# Patient Record
Sex: Female | Born: 1960 | Race: White | Hispanic: No | Marital: Married | Smoking: Never smoker
Health system: Southern US, Community
[De-identification: ages and names within clinical notes are randomized; demographics above are authoritative.]

---

## 2004-11-04 ENCOUNTER — Ambulatory Visit: Payer: Self-pay

## 2004-11-11 ENCOUNTER — Ambulatory Visit: Payer: Self-pay

## 2005-12-06 ENCOUNTER — Ambulatory Visit: Payer: Self-pay

## 2006-12-13 ENCOUNTER — Ambulatory Visit: Payer: Self-pay

## 2006-12-15 ENCOUNTER — Ambulatory Visit: Payer: Self-pay

## 2007-06-19 ENCOUNTER — Ambulatory Visit: Payer: Self-pay

## 2008-06-05 ENCOUNTER — Ambulatory Visit: Payer: Self-pay

## 2008-08-15 ENCOUNTER — Ambulatory Visit: Payer: Self-pay | Admitting: Internal Medicine

## 2009-04-29 ENCOUNTER — Ambulatory Visit: Payer: Self-pay | Admitting: Internal Medicine

## 2009-06-18 ENCOUNTER — Ambulatory Visit: Payer: Self-pay

## 2009-06-23 ENCOUNTER — Other Ambulatory Visit: Payer: Self-pay

## 2010-06-08 ENCOUNTER — Ambulatory Visit: Payer: Self-pay | Admitting: Internal Medicine

## 2010-07-01 ENCOUNTER — Ambulatory Visit: Payer: Self-pay

## 2011-04-03 ENCOUNTER — Emergency Department: Payer: Self-pay | Admitting: Emergency Medicine

## 2011-04-22 ENCOUNTER — Ambulatory Visit: Payer: Self-pay | Admitting: Internal Medicine

## 2011-08-03 ENCOUNTER — Ambulatory Visit: Payer: Self-pay | Admitting: Obstetrics

## 2012-09-11 ENCOUNTER — Ambulatory Visit: Payer: Self-pay | Admitting: Family Medicine

## 2015-03-04 ENCOUNTER — Other Ambulatory Visit: Payer: Self-pay | Admitting: Family Medicine

## 2015-03-04 DIAGNOSIS — Z1231 Encounter for screening mammogram for malignant neoplasm of breast: Secondary | ICD-10-CM

## 2015-03-10 ENCOUNTER — Ambulatory Visit
Admission: RE | Admit: 2015-03-10 | Discharge: 2015-03-10 | Disposition: A | Discharge status: Home / Self Care | Payer: No Typology Code available for payment source | Source: Ambulatory Visit | Attending: Family Medicine | Admitting: Family Medicine

## 2015-03-10 ENCOUNTER — Other Ambulatory Visit: Payer: Self-pay | Admitting: Family Medicine

## 2015-03-10 DIAGNOSIS — Z1231 Encounter for screening mammogram for malignant neoplasm of breast: Secondary | ICD-10-CM | POA: Diagnosis not present

## 2015-03-10 DIAGNOSIS — M25475 Effusion, left foot: Secondary | ICD-10-CM

## 2015-03-10 DIAGNOSIS — M79672 Pain in left foot: Secondary | ICD-10-CM | POA: Diagnosis not present

## 2017-03-30 ENCOUNTER — Other Ambulatory Visit: Payer: Self-pay | Admitting: Family Medicine

## 2017-03-30 DIAGNOSIS — Z1231 Encounter for screening mammogram for malignant neoplasm of breast: Secondary | ICD-10-CM

## 2017-04-06 ENCOUNTER — Ambulatory Visit
Admission: RE | Admit: 2017-04-06 | Discharge: 2017-04-06 | Disposition: A | Discharge status: Home / Self Care | Payer: 59 | Source: Ambulatory Visit | Attending: Family Medicine | Admitting: Family Medicine

## 2017-04-06 DIAGNOSIS — Z1231 Encounter for screening mammogram for malignant neoplasm of breast: Secondary | ICD-10-CM | POA: Insufficient documentation

## 2019-04-23 ENCOUNTER — Other Ambulatory Visit: Payer: Self-pay | Admitting: Family Medicine

## 2019-04-23 DIAGNOSIS — Z1231 Encounter for screening mammogram for malignant neoplasm of breast: Secondary | ICD-10-CM

## 2019-04-26 ENCOUNTER — Ambulatory Visit
Admission: RE | Admit: 2019-04-26 | Discharge: 2019-04-26 | Disposition: A | Discharge status: Home / Self Care | Payer: BC Managed Care – PPO | Source: Ambulatory Visit | Attending: Family Medicine | Admitting: Family Medicine

## 2019-04-26 DIAGNOSIS — Z1231 Encounter for screening mammogram for malignant neoplasm of breast: Secondary | ICD-10-CM | POA: Diagnosis present

## 2020-02-24 ENCOUNTER — Other Ambulatory Visit: Payer: Self-pay | Admitting: Family Medicine

## 2020-02-24 ENCOUNTER — Other Ambulatory Visit (HOSPITAL_COMMUNITY): Payer: Self-pay | Admitting: Family Medicine

## 2020-02-24 DIAGNOSIS — R1011 Right upper quadrant pain: Secondary | ICD-10-CM

## 2020-02-28 ENCOUNTER — Ambulatory Visit: Payer: BC Managed Care – PPO

## 2020-06-04 ENCOUNTER — Other Ambulatory Visit: Payer: Self-pay | Admitting: Internal Medicine

## 2020-06-04 DIAGNOSIS — Z1231 Encounter for screening mammogram for malignant neoplasm of breast: Secondary | ICD-10-CM

## 2020-06-10 ENCOUNTER — Other Ambulatory Visit: Payer: Self-pay

## 2020-06-10 ENCOUNTER — Ambulatory Visit
Admission: RE | Admit: 2020-06-10 | Discharge: 2020-06-10 | Disposition: A | Discharge status: Home / Self Care | Payer: BC Managed Care – PPO | Source: Ambulatory Visit | Attending: Internal Medicine | Admitting: Internal Medicine

## 2020-06-10 DIAGNOSIS — Z1231 Encounter for screening mammogram for malignant neoplasm of breast: Secondary | ICD-10-CM | POA: Diagnosis present

## 2021-06-17 ENCOUNTER — Other Ambulatory Visit: Payer: Self-pay | Admitting: Internal Medicine

## 2021-06-17 DIAGNOSIS — Z1231 Encounter for screening mammogram for malignant neoplasm of breast: Secondary | ICD-10-CM

## 2021-07-06 ENCOUNTER — Other Ambulatory Visit: Payer: Self-pay

## 2021-07-06 ENCOUNTER — Ambulatory Visit
Admission: RE | Admit: 2021-07-06 | Discharge: 2021-07-06 | Disposition: A | Discharge status: Home / Self Care | Payer: BC Managed Care – PPO | Source: Ambulatory Visit | Attending: Internal Medicine | Admitting: Internal Medicine

## 2021-07-06 DIAGNOSIS — Z1231 Encounter for screening mammogram for malignant neoplasm of breast: Secondary | ICD-10-CM | POA: Insufficient documentation

## 2021-07-15 ENCOUNTER — Other Ambulatory Visit: Payer: Self-pay | Admitting: *Deleted

## 2021-07-15 DIAGNOSIS — R319 Hematuria, unspecified: Secondary | ICD-10-CM

## 2021-07-15 NOTE — Progress Notes (Signed)
07/16/21 6:11 PM   Adela Lank Vilar 09/01/1960 [id number]  Referring provider:  Rusty Aus, MD [facility] [place],  [place] [address] No chief complaint on file.    HPI: Michaela Fuller is a 61 y.o.female who presents today for further evaluation of hematuria.   She was seen by her PCP, Dr.Miller on 05/2021. She has had mild hematuria intermittently over the years. Her UA showed microscopic blood it was rechecked a month later on 06/28/2021 and revealed moderate blood, 4-10 RBCs, and few bacteria. She was referred to urology.   She reports that she does not have UTIs very often. She denies a smoking history or working in a factory.  No history of kidney stones.  No flank pain.  No gross hematuria.   PMH: No past medical history on file.  Surgical History: No past surgical history on file.  Home Medications:  Allergies as of 07/16/2021       Reactions   Penicillins Nausea Only, Nausea And Vomiting   Other reaction(s): Vomiting        Medication List        Accurate as of July 16, 2021  6:11 PM. If you have any questions, ask your nurse or doctor.          buPROPion 150 MG 12 hr tablet Commonly known as: WELLBUTRIN SR Take 150 mg by mouth 2 (two) times daily.   desvenlafaxine 50 MG 24 hr tablet Commonly known as: PRISTIQ Take 1 tablet by mouth daily.   levothyroxine 88 MCG tablet Commonly known as: SYNTHROID Take 1 tablet by mouth daily.   pregabalin 150 MG capsule Commonly known as: LYRICA Take by mouth.        Allergies:  Allergies  Allergen Reactions   Penicillins Nausea Only and Nausea And Vomiting    Other reaction(s): Vomiting    Family History: Family History  Problem Relation Age of Onset   Breast cancer Sister 15    Social History:  reports that she has never smoked. She has never used smokeless tobacco. She reports that she does not currently use alcohol. No history on file for drug  use.   Physical Exam: BP (!) 145/79    Pulse 90    Ht 5\' 6"  (1.676 m)    Wt 180 lb (81.6 kg)    BMI 29.05 kg/m   Constitutional:  Alert and oriented, No acute distress. HEENT: [place] AT, moist mucus membranes.  Trachea midline, no masses. Cardiovascular: No clubbing, cyanosis, or edema. Respiratory: Normal respiratory effort, no increased work of breathing. Skin: No rashes, bruises or suspicious lesions. Neurologic: Grossly intact, no focal deficits, moving all 4 extremities. Psychiatric: Normal mood and affect.   Urinalysis Component     Latest Ref Rng & Units 07/16/2021  Color, Urine     YELLOW YELLOW  Appearance     CLEAR CLEAR  Specific Gravity, Urine     1.005 - 1.030 <1.005 (L)  pH     5.0 - 8.0 5.5  Glucose, UA     NEGATIVE mg/dL NEGATIVE  Hgb urine dipstick     NEGATIVE SMALL (A)  Bilirubin Urine     NEGATIVE NEGATIVE  Ketones, ur     NEGATIVE mg/dL NEGATIVE  Protein     NEGATIVE mg/dL NEGATIVE  Nitrite     NEGATIVE NEGATIVE  Leukocytes,Ua     NEGATIVE NEGATIVE  Squamous Epithelial / LPF     0 - 5  0-5  WBC, UA     0 - 5 WBC/hpf NONE SEEN  RBC / HPF     0 - 5 RBC/hpf NONE SEEN  Bacteria, UA     NONE SEEN NONE SEEN   Assessment & Plan:    Low risk microscopic hematuria  - Urinalysis today unremarkable but she has had several incidents of low degree of microscopic hematuria in the absence of infection - We discussed the differential diagnosis for microscopic hematuria including nephrolithiasis, renal or upper tract tumors, bladder stones, UTIs, or bladder tumors as well as undetermined etiologies. - Per AUA guidelines, I did recommend a RUS and office cystoscopy. - RUS; scheduled   Return for cystoscopy and RUS results   Conley Rolls as a scribe for Hollice Espy, MD.,have documented all relevant documentation on the behalf of Hollice Espy, MD,as directed by  Hollice Espy, MD while in the presence of Hollice Espy, MD.  I have reviewed  the above documentation for accuracy and completeness, and I agree with the above.   Hollice Espy, MD  [facility] [address] [place], [place] [address] [phone]

## 2021-07-16 ENCOUNTER — Other Ambulatory Visit: Payer: Self-pay

## 2021-07-16 ENCOUNTER — Ambulatory Visit (INDEPENDENT_AMBULATORY_CARE_PROVIDER_SITE_OTHER): Payer: BC Managed Care – PPO | Admitting: Urology

## 2021-07-16 ENCOUNTER — Other Ambulatory Visit
Admission: RE | Admit: 2021-07-16 | Discharge: 2021-07-16 | Disposition: A | Discharge status: Home / Self Care | Payer: BC Managed Care – PPO | Attending: Urology | Admitting: Urology

## 2021-07-16 ENCOUNTER — Encounter: Payer: Self-pay | Admitting: Urology

## 2021-07-16 VITALS — BP 145/79 | HR 90 | Ht 66.0 in | Wt 180.0 lb

## 2021-07-16 DIAGNOSIS — R319 Hematuria, unspecified: Secondary | ICD-10-CM | POA: Insufficient documentation

## 2021-07-16 LAB — URINALYSIS, COMPLETE (UACMP) WITH MICROSCOPIC
Bacteria, UA: NONE SEEN
Bilirubin Urine: NEGATIVE
Glucose, UA: NEGATIVE mg/dL
Ketones, ur: NEGATIVE mg/dL
Leukocytes,Ua: NEGATIVE
Nitrite: NEGATIVE
Protein, ur: NEGATIVE mg/dL
RBC / HPF: NONE SEEN RBC/hpf (ref 0–5)
Specific Gravity, Urine: <1.005 — ABNORMAL LOW (ref 1.005–1.030)
WBC, UA: NONE SEEN WBC/hpf (ref 0–5)
pH: 5.5 (ref 5.0–8.0)

## 2021-07-16 NOTE — Patient Instructions (Signed)

## 2021-07-22 ENCOUNTER — Encounter: Payer: Self-pay | Admitting: Urology

## 2021-07-28 ENCOUNTER — Ambulatory Visit
Admission: RE | Admit: 2021-07-28 | Discharge: 2021-07-28 | Disposition: A | Discharge status: Home / Self Care | Payer: BC Managed Care – PPO | Source: Ambulatory Visit | Attending: Urology | Admitting: Urology

## 2021-07-28 ENCOUNTER — Other Ambulatory Visit: Payer: Self-pay

## 2021-07-28 DIAGNOSIS — R319 Hematuria, unspecified: Secondary | ICD-10-CM | POA: Insufficient documentation

## 2021-07-30 ENCOUNTER — Ambulatory Visit: Payer: BC Managed Care – PPO | Admitting: Urology

## 2021-09-03 ENCOUNTER — Ambulatory Visit: Payer: BC Managed Care – PPO | Admitting: Urology

## 2021-09-15 ENCOUNTER — Other Ambulatory Visit: Payer: Self-pay

## 2021-09-15 DIAGNOSIS — R319 Hematuria, unspecified: Secondary | ICD-10-CM

## 2021-09-16 NOTE — Progress Notes (Signed)
? ?  09/17/2021 ? ?CC:  ?Chief Complaint  ?Patient presents with  ? Cysto  ? ? ?HPI: ?Michaela Fuller is a 61 y.o.female with a personal history of low risk microscopic hematuria, who presents today for a diagnostic cystoscopy.  ? ?She has had mild hematuria intermittently over the years. ? ?RUS on 07/28/2021 visualized normal radiographic imaging.  ? ?UA shows small Hgb urine but was otherwise unremarkable.  ? ?Vitals:  ? 09/17/21 1523  ?BP: 139/85  ?Pulse: 88  ? ?NED. A&Ox3.   ?No respiratory distress   ?Abd soft, NT, ND ?Normal external genitalia with patent urethral meatus ? ?Cystoscopy Procedure Note ? ?Patient identification was confirmed, informed consent was obtained, and patient was prepped using Betadine solution.  Lidocaine jelly was administered per urethral meatus.   ? ?Procedure: ?- Flexible cystoscope introduced, without any difficulty.   ?- Thorough search of the bladder revealed: ?   normal urethral meatus ?   normal urothelium ?   no stones ?   no ulcers  ?   no tumors ?   no urethral polyps ?   no trabeculation ? ?- Ureteral orifices were normal in position and appearance. ? ?Post-Procedure: ?- Patient tolerated the procedure well ? ?Assessment/ Plan: ? ?Low risk microscopic hematuria  ?- Cystoscopy is unremarkable  ?- UA unremarkable today  ?-Renal ultrasound also negative ? ?Discussed today if microscopic hematuria persists, would recommend reevaluation in 2 to 3 years.  We will CC PCP to refer back as needed. ? ?Conley Rolls as a scribe for Hollice Espy, MD.,have documented all relevant documentation on the behalf of Hollice Espy, MD,as directed by  Hollice Espy, MD while in the presence of Hollice Espy, MD. ?

## 2021-09-17 ENCOUNTER — Other Ambulatory Visit
Admission: RE | Admit: 2021-09-17 | Discharge: 2021-09-17 | Disposition: A | Discharge status: Home / Self Care | Payer: BC Managed Care – PPO | Source: Ambulatory Visit | Attending: Urology | Admitting: Urology

## 2021-09-17 ENCOUNTER — Ambulatory Visit (INDEPENDENT_AMBULATORY_CARE_PROVIDER_SITE_OTHER): Payer: BC Managed Care – PPO | Admitting: Urology

## 2021-09-17 VITALS — BP 139/85 | HR 88 | Ht 66.0 in | Wt 190.0 lb

## 2021-09-17 DIAGNOSIS — R3129 Other microscopic hematuria: Secondary | ICD-10-CM

## 2021-09-17 DIAGNOSIS — R319 Hematuria, unspecified: Secondary | ICD-10-CM

## 2021-09-17 LAB — URINALYSIS, COMPLETE (UACMP) WITH MICROSCOPIC
Bacteria, UA: NONE SEEN
Bilirubin Urine: NEGATIVE
Glucose, UA: NEGATIVE mg/dL
Ketones, ur: NEGATIVE mg/dL
Leukocytes,Ua: NEGATIVE
Nitrite: NEGATIVE
Protein, ur: NEGATIVE mg/dL
Specific Gravity, Urine: 1.01 (ref 1.005–1.030)
Squamous Epithelial / HPF: NONE SEEN (ref 0–5)
pH: 7 (ref 5.0–8.0)

## 2022-06-23 ENCOUNTER — Other Ambulatory Visit: Payer: Self-pay | Admitting: Internal Medicine

## 2022-06-23 DIAGNOSIS — Z1231 Encounter for screening mammogram for malignant neoplasm of breast: Secondary | ICD-10-CM

## 2022-07-07 ENCOUNTER — Ambulatory Visit
Admission: RE | Admit: 2022-07-07 | Discharge: 2022-07-07 | Disposition: A | Discharge status: Home / Self Care | Payer: BC Managed Care – PPO | Source: Ambulatory Visit | Attending: Internal Medicine | Admitting: Internal Medicine

## 2022-07-07 DIAGNOSIS — Z1231 Encounter for screening mammogram for malignant neoplasm of breast: Secondary | ICD-10-CM | POA: Diagnosis present

## 2023-06-08 ENCOUNTER — Other Ambulatory Visit: Payer: Self-pay | Admitting: Internal Medicine

## 2023-06-08 DIAGNOSIS — Z1231 Encounter for screening mammogram for malignant neoplasm of breast: Secondary | ICD-10-CM

## 2023-07-10 ENCOUNTER — Ambulatory Visit: Payer: BC Managed Care – PPO

## 2023-07-17 ENCOUNTER — Ambulatory Visit
Admission: RE | Admit: 2023-07-17 | Discharge: 2023-07-17 | Disposition: A | Discharge status: Home / Self Care | Payer: BC Managed Care – PPO | Source: Ambulatory Visit | Attending: Internal Medicine | Admitting: Internal Medicine

## 2023-07-17 DIAGNOSIS — Z1231 Encounter for screening mammogram for malignant neoplasm of breast: Secondary | ICD-10-CM | POA: Insufficient documentation

## 2023-09-30 IMAGING — MG MM DIGITAL SCREENING BILAT W/ TOMO AND CAD
6 of 10 series · 6 of 30 positions shown · non-contrast
Comparison: Previous exam(s).

CLINICAL DATA: Screening.

EXAM:
DIGITAL SCREENING BILATERAL MAMMOGRAM WITH TOMOSYNTHESIS AND CAD
TECHNIQUE: Bilateral screening digital craniocaudal and mediolateral oblique
mammograms were obtained. Bilateral screening digital breast
tomosynthesis was performed. The images were evaluated with
computer-aided detection.

[R MLO synth-2D]
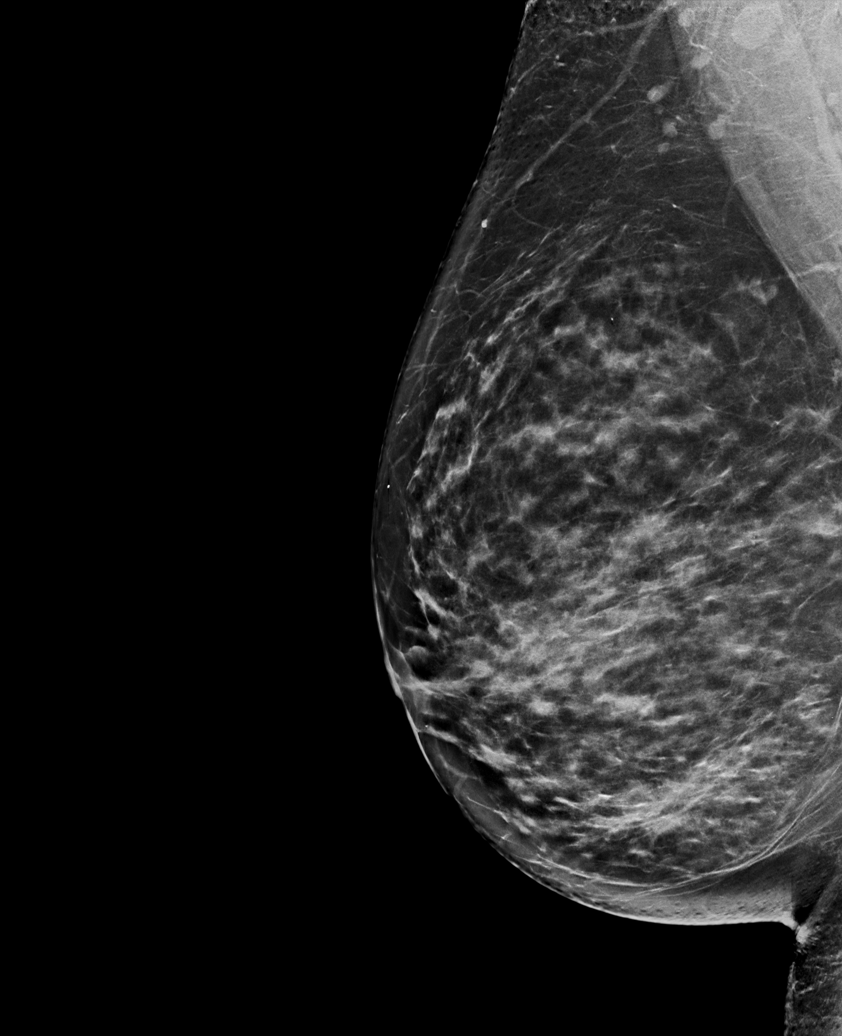

[L MLO synth-2D]
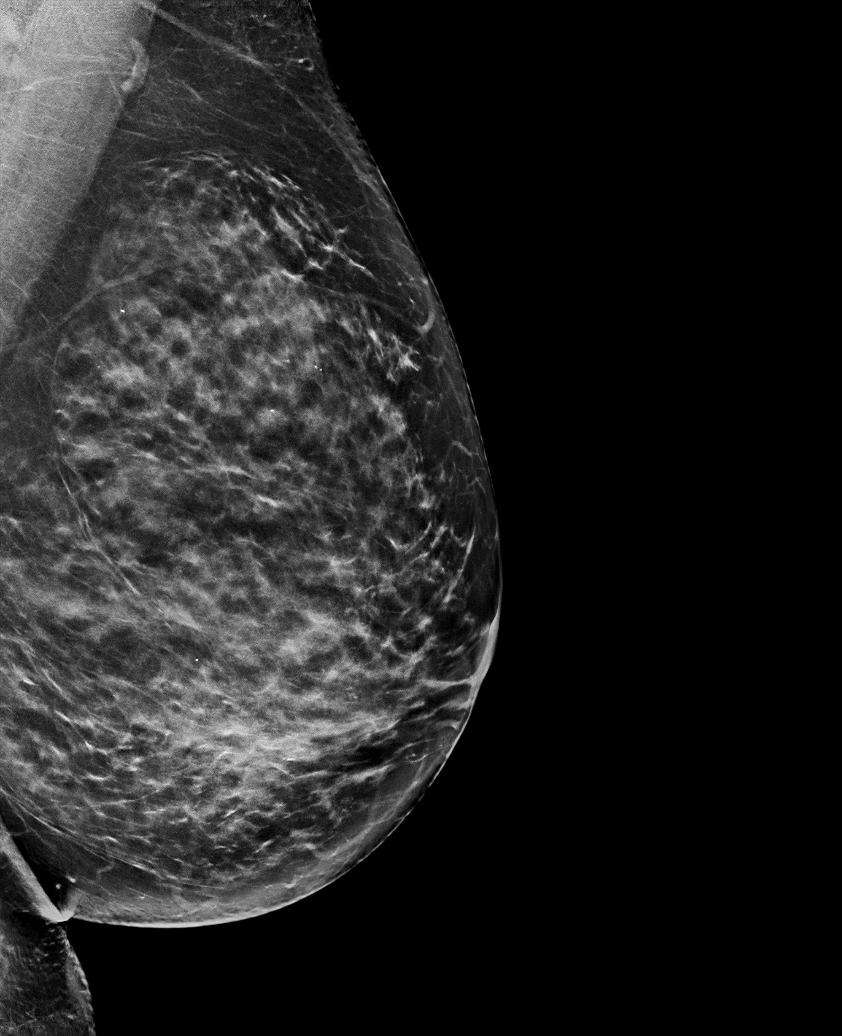

[R CC synth-2D (1 of 2)]
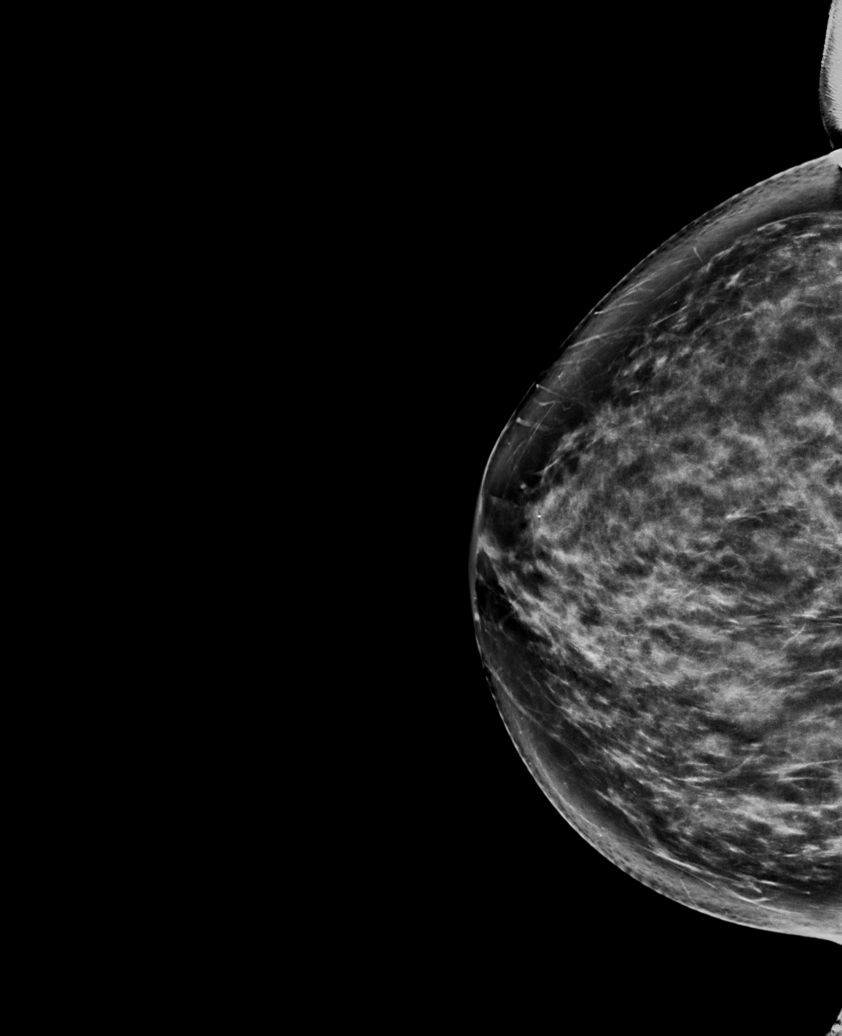

[R CC synth-2D (2 of 2)]
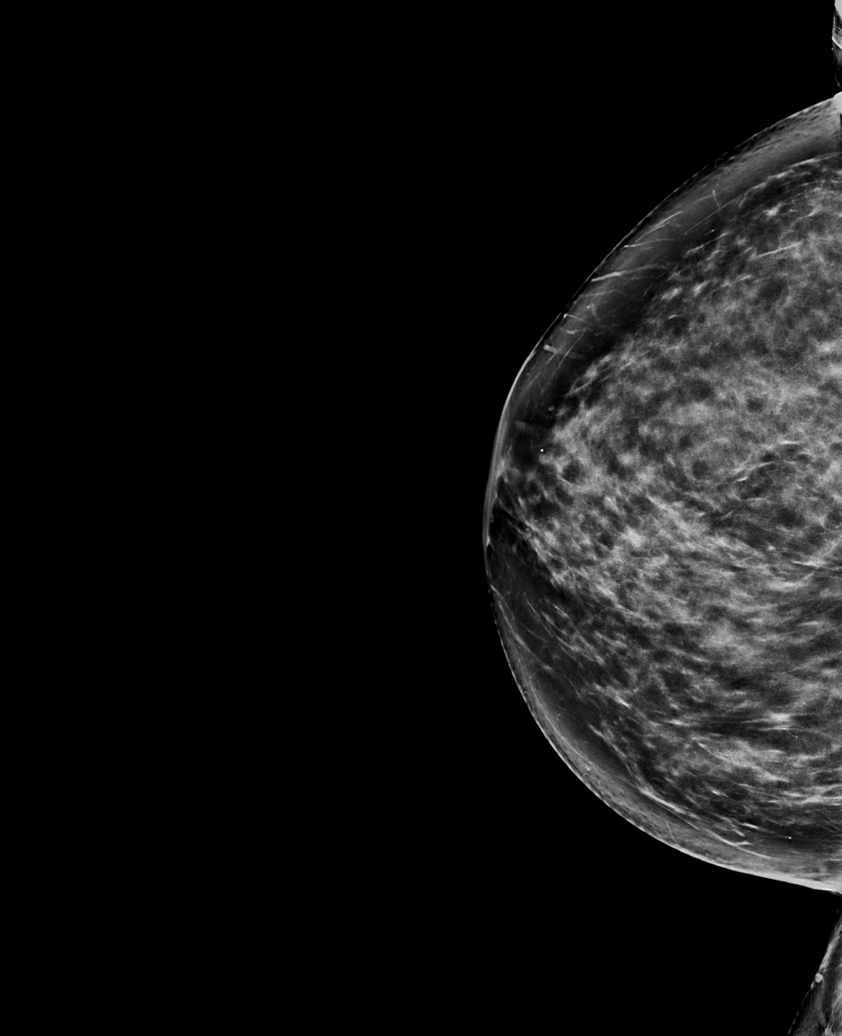

[L CC synth-2D]
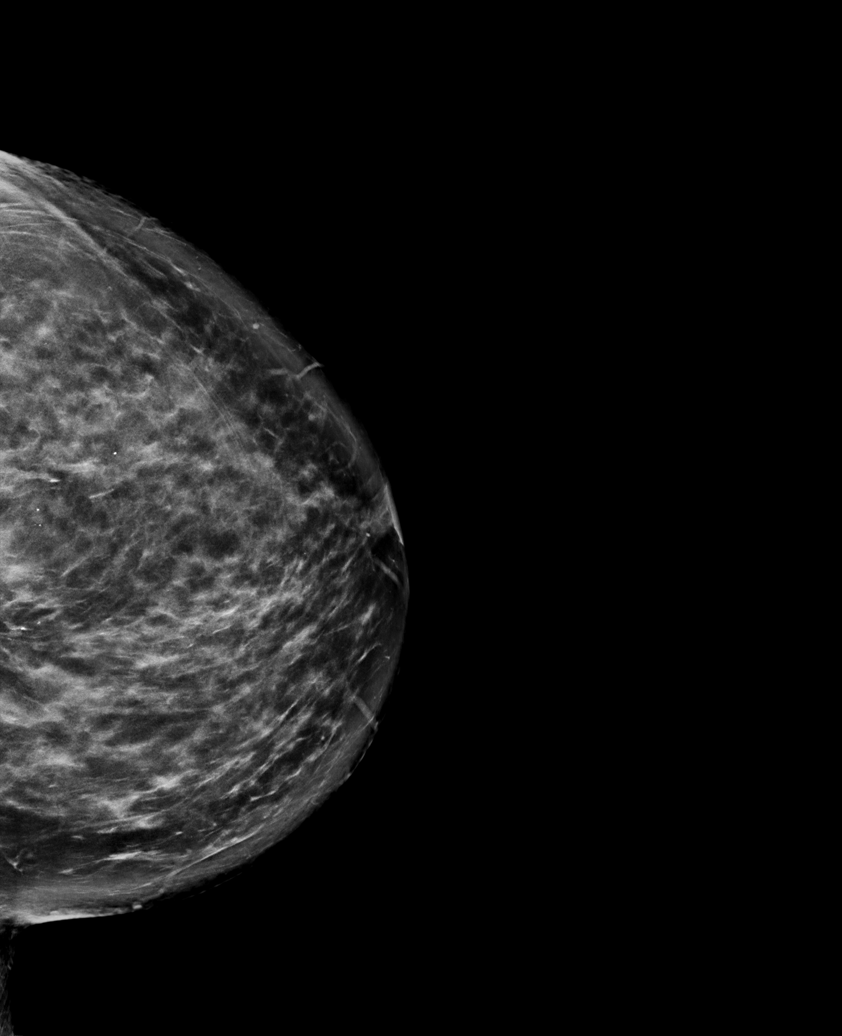

[R CC tomo · tomo slice 43/85.0]
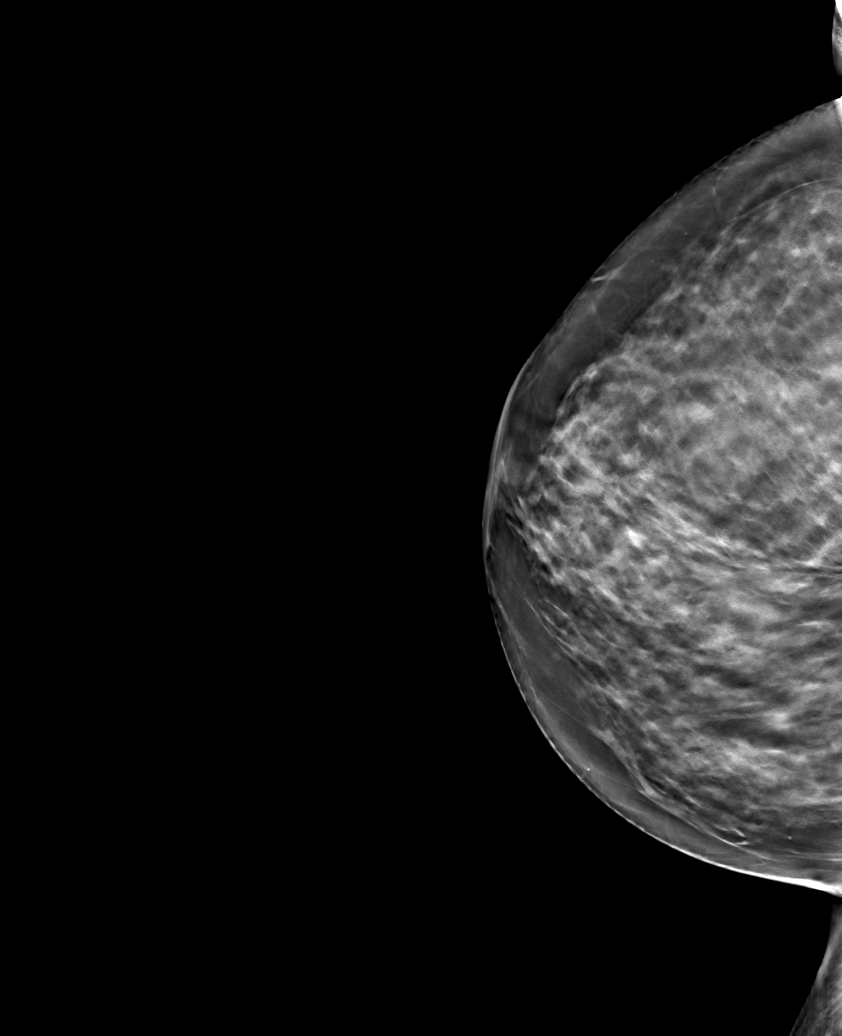

[6 of 30 positions shown; findings below may reference images not displayed]

ACR Breast Density Category c: The breast tissue is heterogeneously
dense, which may obscure small masses.
FINDINGS: There are no findings suspicious for malignancy.
IMPRESSION: No mammographic evidence of malignancy. A result letter of this
screening mammogram will be mailed directly to the patient.

RECOMMENDATION:
Screening mammogram in one year. (Code:Q3-W-BC3)

BI-RADS CATEGORY  1: Negative.

## 2023-10-22 IMAGING — US US RENAL
1 series · 14 of 25 positions shown · non-contrast
Comparison: None.

CLINICAL DATA: Microscopic hematuria

EXAM:
RENAL / URINARY TRACT ULTRASOUND COMPLETE

[Series 1: us renal · 0.23mm/px · 14 of 30 slices shown]
[im 1/30]
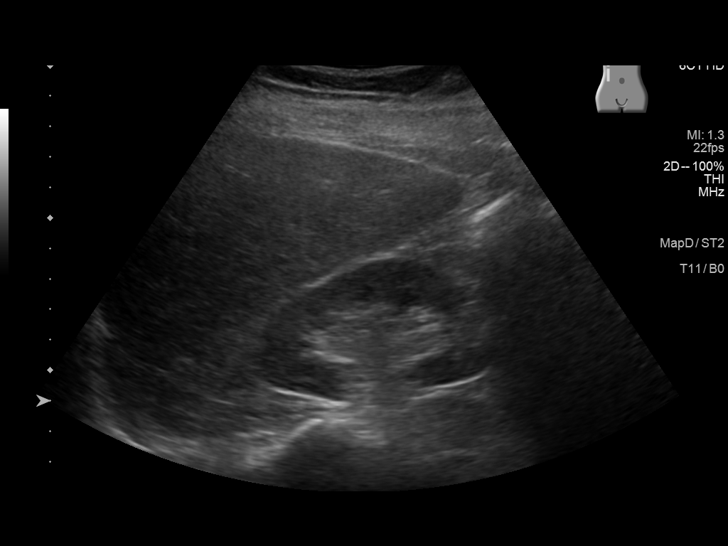
[im 3/30]
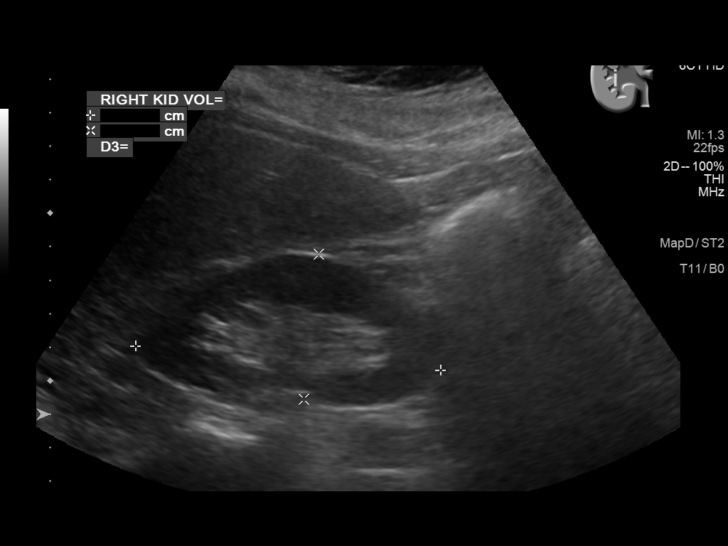
[im 5/30]
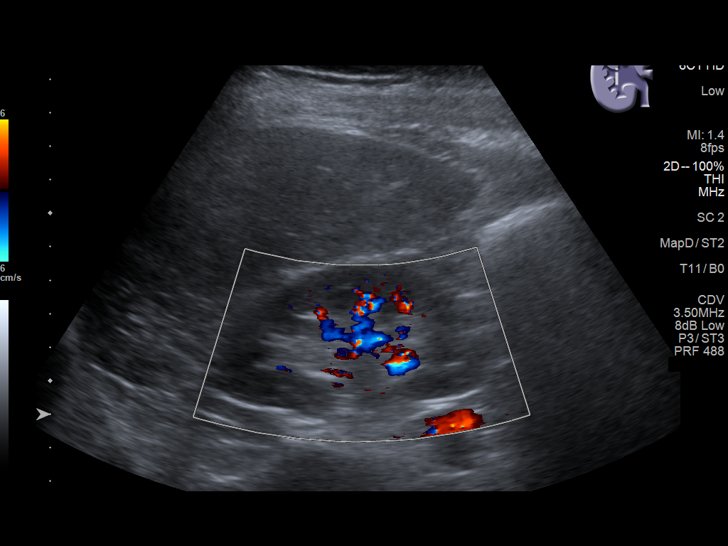
[im 8/30]
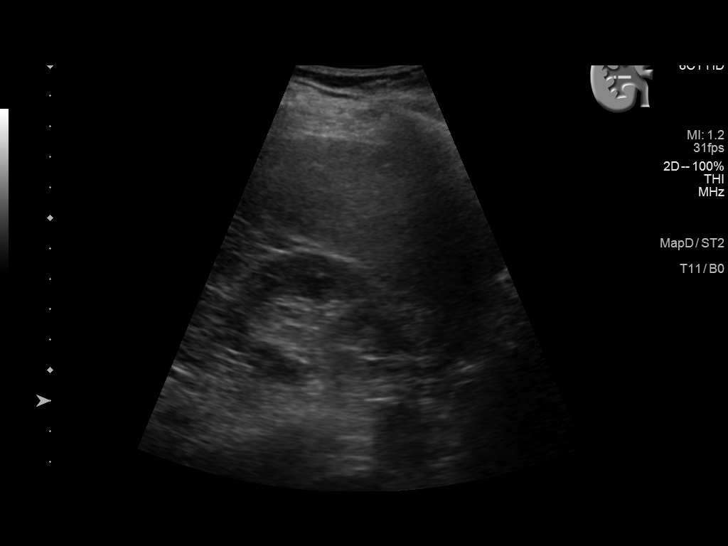
[im 10/30]
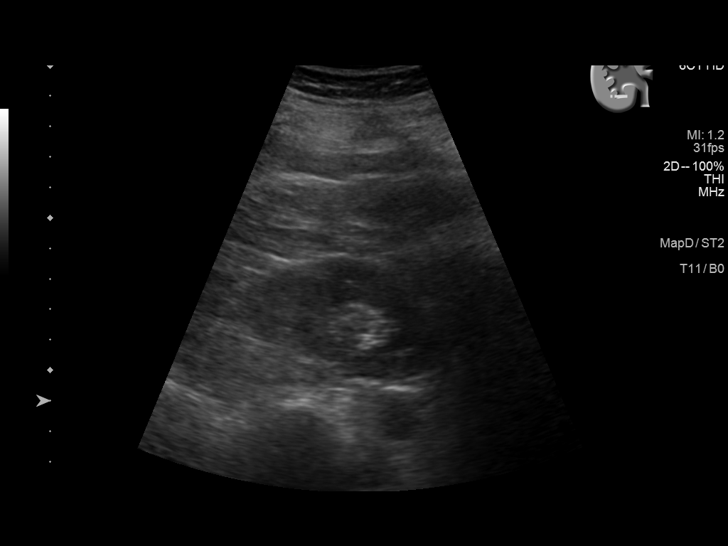
[im 11/30]
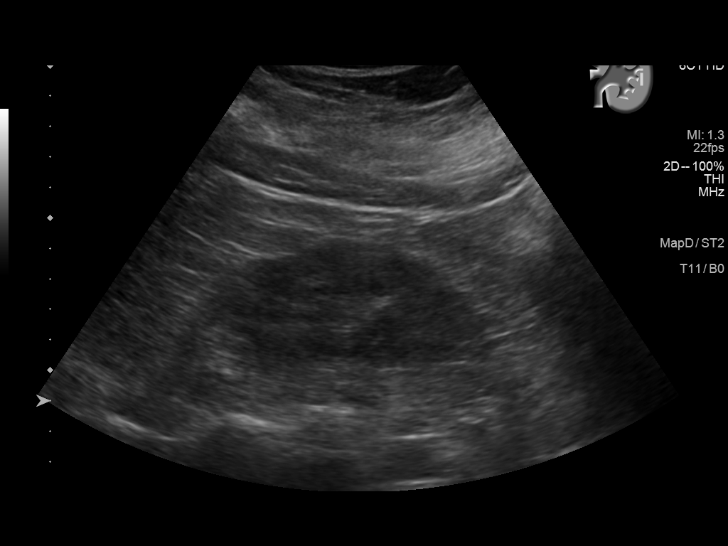
[im 14/30]
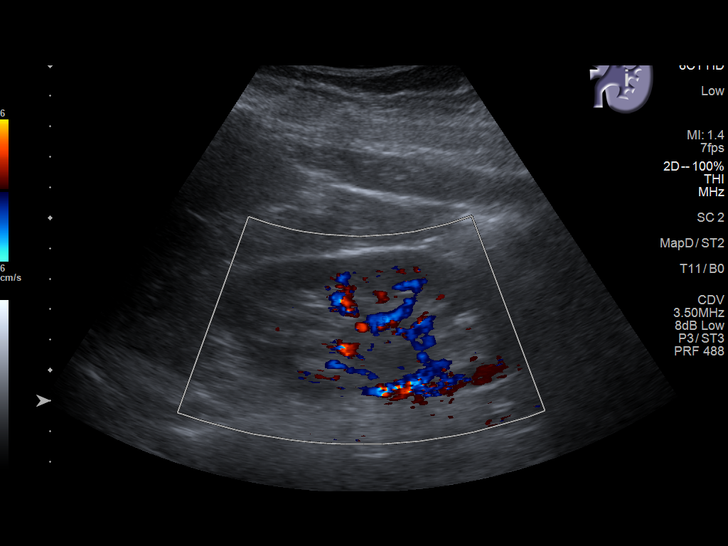
[im 16/30]
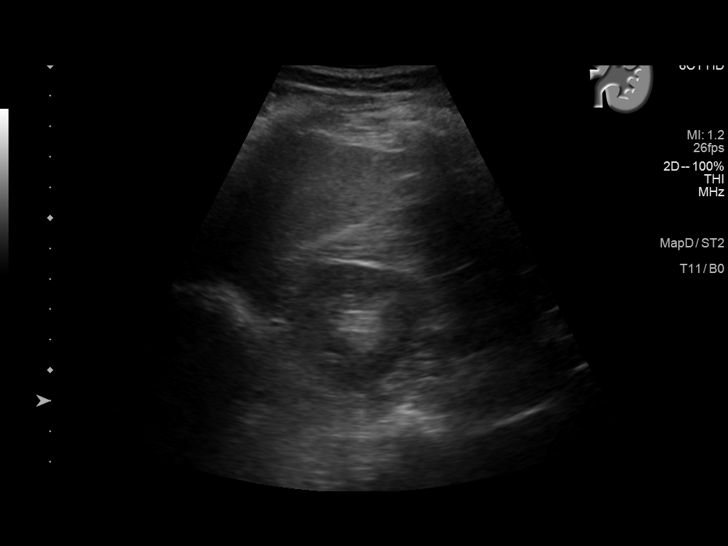
[im 19/30]
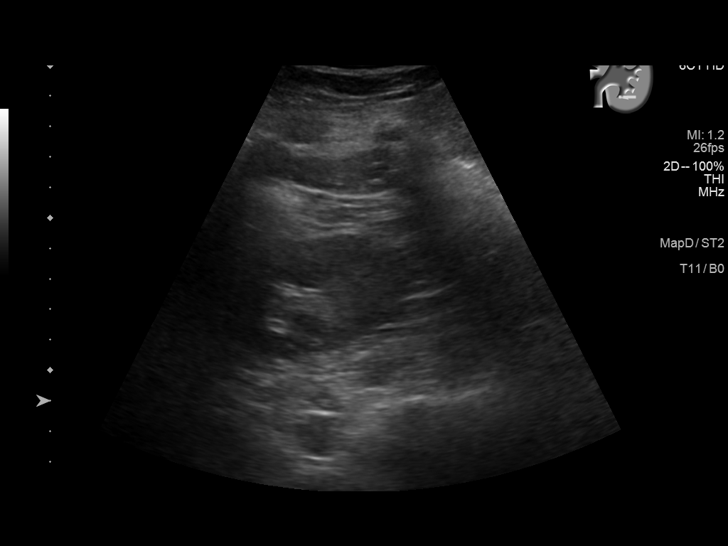
[im 20/30]
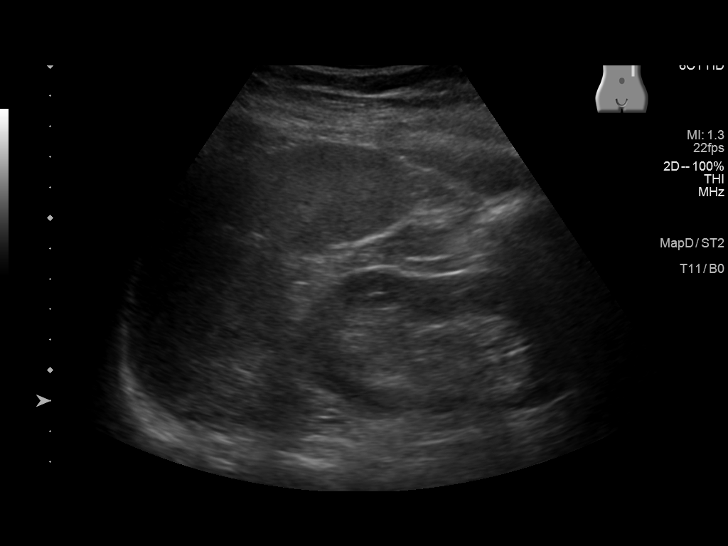
[im 22/30]
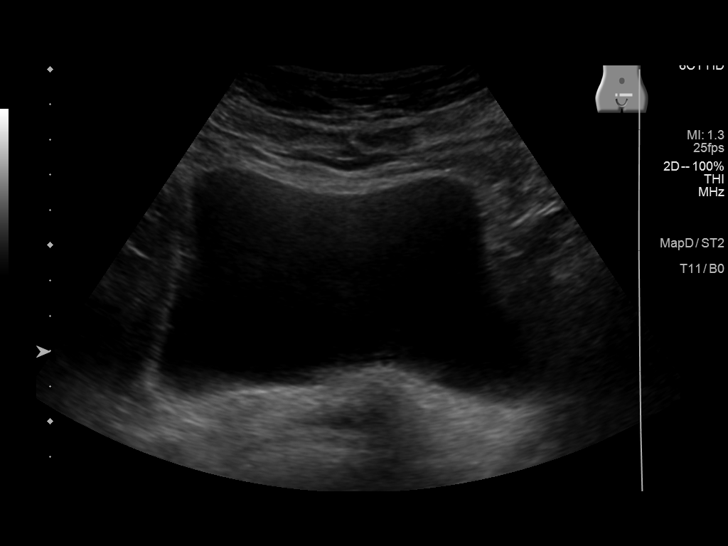
[im 25/30]
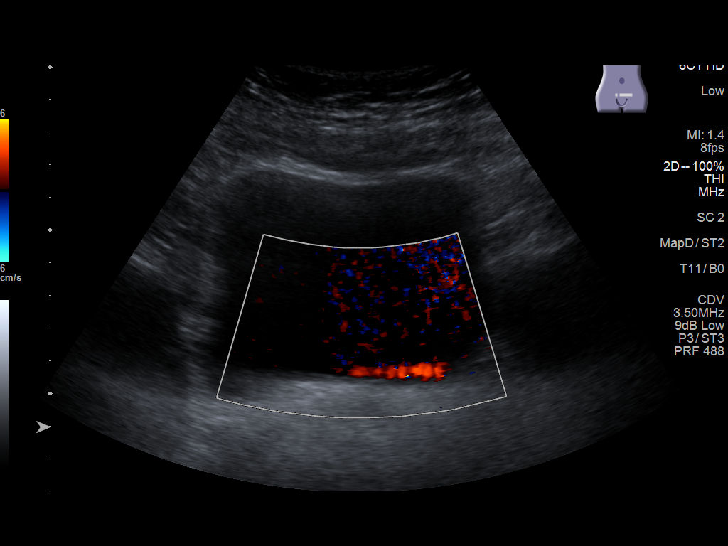
[im 27/30]
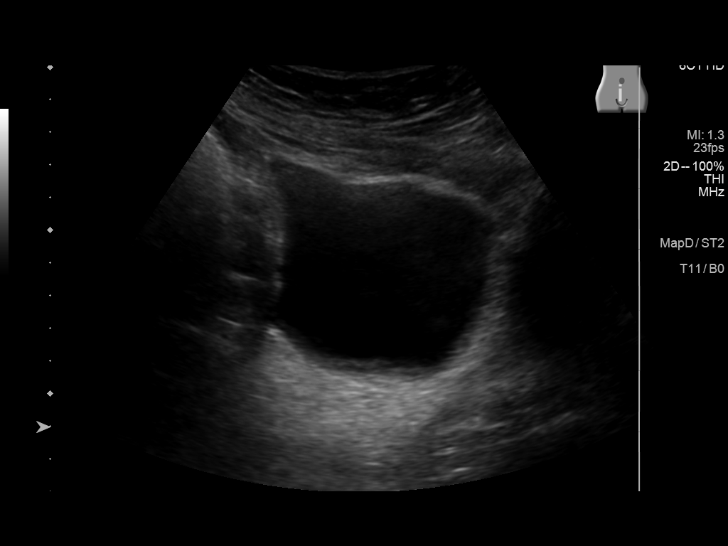
[im 30/30]
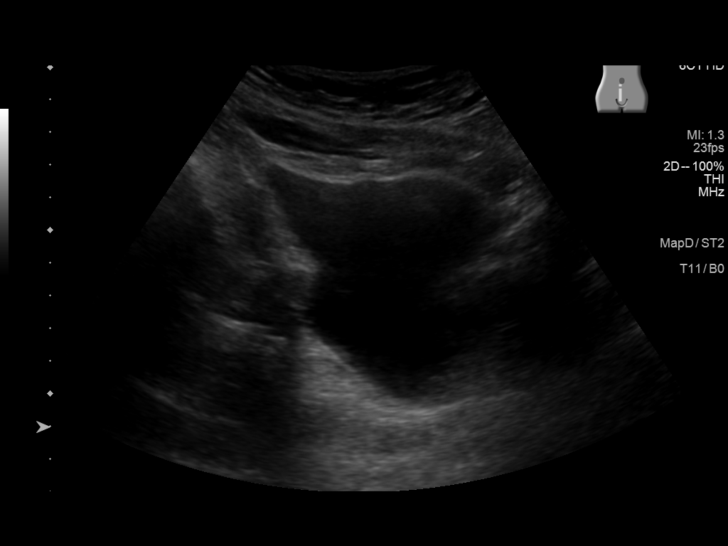

[14 of 25 positions shown; findings below may reference images not displayed]

FINDINGS: Right Kidney:

Renal measurements: 9.1 x 4.4 x 4.3 cm = volume: 90 mL. Echogenicity
within normal limits. No mass or hydronephrosis visualized.

Left Kidney:

Renal measurements: 10.4 x 5.2 x 5.0 cm = volume: 42 mL.
Echogenicity within normal limits. No mass or hydronephrosis
visualized.

Bladder:

Appears normal for degree of bladder distention.

Other:

None.
IMPRESSION: 1. Normal.

## 2024-06-10 ENCOUNTER — Other Ambulatory Visit: Payer: Self-pay | Admitting: Internal Medicine

## 2024-06-10 DIAGNOSIS — Z1231 Encounter for screening mammogram for malignant neoplasm of breast: Secondary | ICD-10-CM

## 2024-07-17 ENCOUNTER — Encounter
